# Patient Record
Sex: Male | Born: 1935 | Race: White | Hispanic: No | Marital: Married
Health system: Southern US, Community
[De-identification: ages and names within clinical notes are randomized; demographics above are authoritative.]

---

## 2002-04-02 ENCOUNTER — Ambulatory Visit: Admission: RE | Admit: 2002-04-02 | Discharge: 2002-06-14 | Payer: Self-pay | Admitting: *Deleted

## 2002-07-19 ENCOUNTER — Ambulatory Visit: Admission: RE | Admit: 2002-07-19 | Discharge: 2002-07-19 | Payer: Self-pay | Admitting: *Deleted

## 2002-07-26 ENCOUNTER — Ambulatory Visit: Admission: RE | Admit: 2002-07-26 | Discharge: 2002-07-26 | Payer: Self-pay | Admitting: *Deleted

## 2015-06-16 ENCOUNTER — Other Ambulatory Visit: Payer: Self-pay | Admitting: Rehabilitation

## 2015-06-16 DIAGNOSIS — M5136 Other intervertebral disc degeneration, lumbar region: Secondary | ICD-10-CM

## 2015-06-29 ENCOUNTER — Ambulatory Visit
Admission: RE | Admit: 2015-06-29 | Discharge: 2015-06-29 | Disposition: A | Discharge status: Home / Self Care | Payer: Medicare Other | Source: Ambulatory Visit | Attending: Rehabilitation | Admitting: Rehabilitation

## 2015-06-29 DIAGNOSIS — M5136 Other intervertebral disc degeneration, lumbar region: Secondary | ICD-10-CM

## 2015-06-29 MED ORDER — MEPERIDINE HCL 100 MG/ML IJ SOLN
50.0000 mg | Freq: Once | INTRAMUSCULAR | Status: AC
  Administered 2015-06-29: 50 mg via INTRAMUSCULAR

## 2015-06-29 MED ORDER — ONDANSETRON HCL 4 MG/2ML IJ SOLN
4.0000 mg | Freq: Once | INTRAMUSCULAR | Status: AC
  Administered 2015-06-29: 4 mg via INTRAMUSCULAR

## 2015-06-29 MED ORDER — IOHEXOL 180 MG/ML  SOLN
15.0000 mL | Freq: Once | INTRAMUSCULAR | Status: AC | PRN
  Administered 2015-06-29: 15 mL via INTRATHECAL

## 2015-06-29 NOTE — Discharge Instructions (Signed)

## 2016-05-14 IMAGING — CT CT L SPINE W/ CM
1 of 6 series · 6 of 14 positions shown, 8 images · non-contrast
Comparison: Noncontrast CT 04/24/2015.

CLINICAL DATA: Low back pain.  LEFT leg pain.
TECHNIQUE: Contiguous axial images were obtained through the Lumbar spine after
the intrathecal infusion of infusion. Coronal and sagittal
reconstructions were obtained of the axial image sets.

[Series 2: l spine soft · axial · 0.29mm/px · z∈[+889,+1054]mm · 6 of 79 slices shown, 8 images]
[im 12/79  soft-tissue]
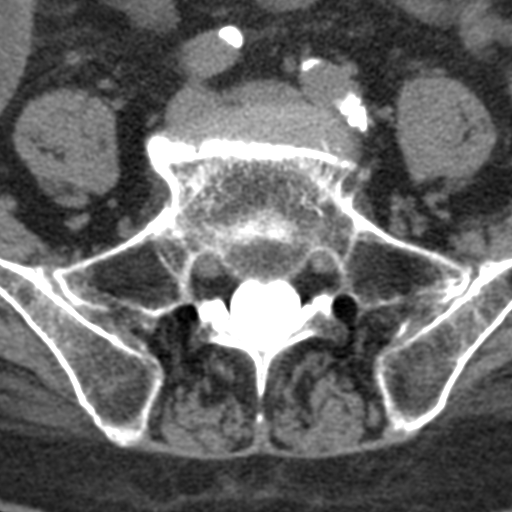
[im 12/79  bone]
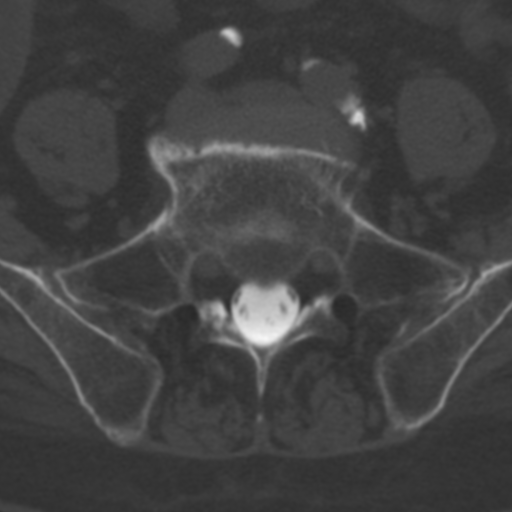
[im 23/79  bone]
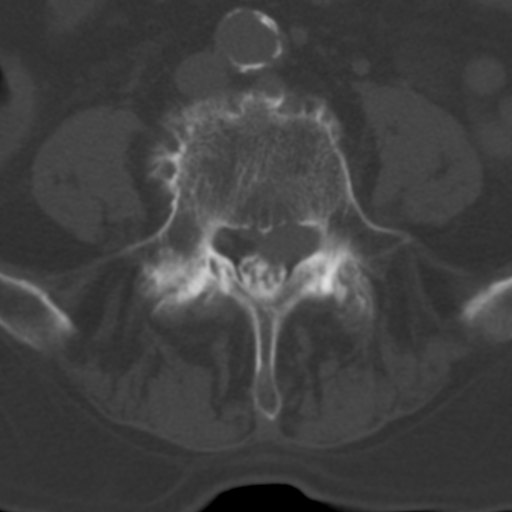
[im 34/79  bone]
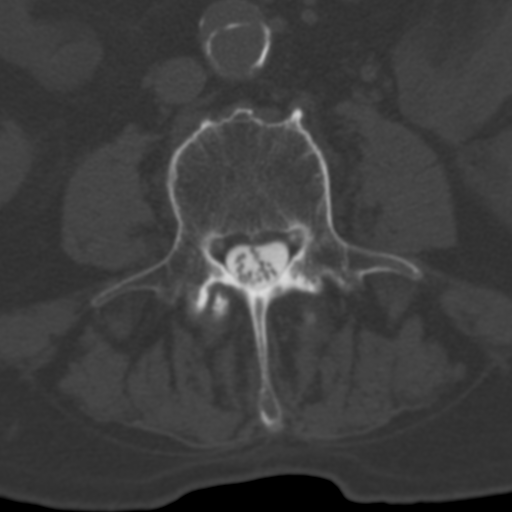
[im 45/79  bone]
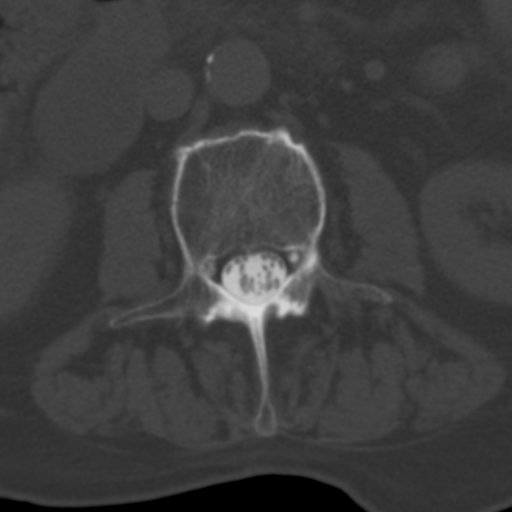
[im 56/79  soft-tissue]
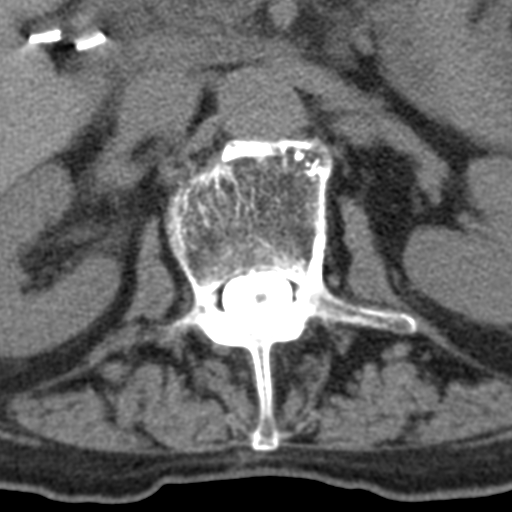
[im 56/79  bone]
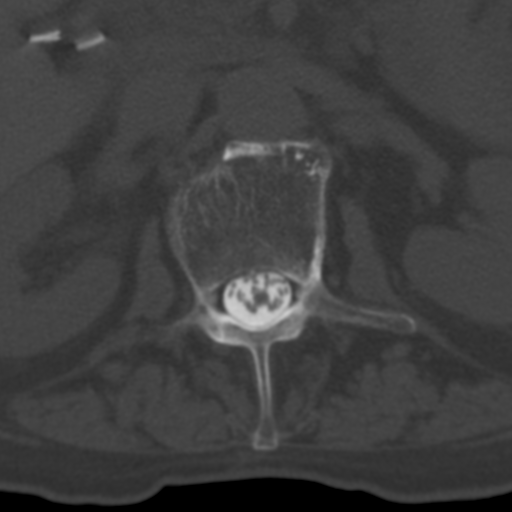
[im 67/79  bone]
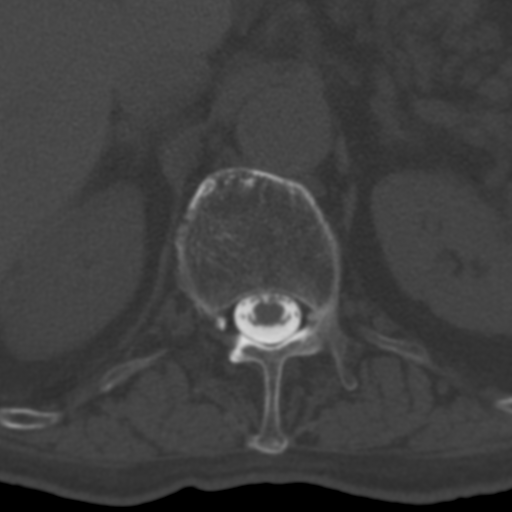

[6 of 14 positions shown; findings below may reference images not displayed]

EXAM:
LUMBAR MYELOGRAM

FLUOROSCOPY TIME:  1 minutes and 1 second.  Eighteen spot films.

PROCEDURE:
After thorough discussion of risks and benefits of the procedure
including bleeding, infection, injury to nerves, blood vessels,
adjacent structures as well as headache and CSF leak, written and
oral informed consent was obtained. Consent was obtained by Dr. Britteny
Zubair. Time out form was completed.

Patient was positioned prone on the fluoroscopy table. Local
anesthesia was provided with 1% lidocaine without epinephrine after
prepped and draped in the usual sterile fashion. Puncture was
performed at mid L3 level using a 3 1/2 inch 25 gauge spinal needle
via midline approach. Using a single pass through the dura, the
needle was placed within the thecal sac, with return of clear CSF.
15 mL of Isovue M 200 was injected into the thecal sac, with normal
opacification of the nerve roots and cauda equina consistent with
free flow within the subarachnoid space.

I personally performed the lumbar puncture and administered the
intrathecal contrast. I also personally supervised acquisition of
the myelogram images.
FINDINGS: LUMBAR MYELOGRAM FINDINGS:

Good opacification lumbar subarachnoid space. Near complete block at
L4-5 due to a large disc extrusion. BILATERAL L5 nerve root
impingement. Insufficient visualization of the L5-S1 level.
Thoracolumbar spine ankylosis to the level of L3. Disc space
narrowing L5-S1. Anatomic alignment without dynamic instability.
Osteopenia.

CT LUMBAR MYELOGRAM FINDINGS:

Segmentation: Normal.

Alignment:  Normal.

Vertebrae: No worrisome osseous lesion.Severe osteopenia.

Conus medullaris: Normal in size. Abnormally low termination at L2-3
without features of occult spinal dysraphism.

Paraspinal tissues: No evidence for hydronephrosis or paravertebral
mass. Cholecystectomy. Aortoiliac atherosclerosis.

Disc levels:

L1-L2:  Unremarkable.

L2-L3: Calcified annulus without significant protrusion. Facet
arthropathy. No impingement.

L3-L4: Unremarkable disc space. Anterior osseous spurring projects
into the retroperitoneum. Mild facet arthropathy. No impingement.

L4-L5: Mild disc space narrowing. Vacuum phenomenon. Subchondral
cyst formation of the endplates above and below. There is a
partially calcified central and leftward disc extrusion causing
severe spinal stenosis. Posterior element hypertrophy is
contributory. Disc osteophyte complex extends into both neural
foramina. LEFT greater than RIGHT L5 and L4 nerve root impingement.

L5-S1: Calcified broad-based, RIGHT paracentral protrusion.
BILATERAL facet arthropathy and ligamentum flavum hypertrophy.
Significant endplate spurring. Significant subarticular zone and
foraminal zone narrowing is multifactorial. BILATERAL S1 and L5
nerve root impingement is observed, slightly worse on the RIGHT.
IMPRESSION: LUMBAR MYELOGRAM IMPRESSION:

Near complete block at L4-5 due to a large central disc extrusion.
No dynamic instability. Disc space narrowing L5-S1.

CT LUMBAR MYELOGRAM IMPRESSION:

Severe spinal stenosis at L4-5 secondary to a partially calcified
central and leftward disc extrusion along with posterior element
hypertrophy. Osseous ridging. LEFT greater than RIGHT L5 and L4
nerve root impingement.

Disc space narrowing L5-S1 with a RIGHT paracentral protrusion.
Osseous ridging and posterior element hypertrophy or contributory to
BILATERAL RIGHT greater than LEFT L5 and S1 nerve root impingement.

## 2017-09-18 DIAGNOSIS — E039 Hypothyroidism, unspecified: Secondary | ICD-10-CM | POA: Diagnosis not present

## 2017-09-18 DIAGNOSIS — I639 Cerebral infarction, unspecified: Secondary | ICD-10-CM | POA: Diagnosis not present

## 2017-09-18 DIAGNOSIS — J9601 Acute respiratory failure with hypoxia: Secondary | ICD-10-CM

## 2017-09-18 DIAGNOSIS — J189 Pneumonia, unspecified organism: Secondary | ICD-10-CM | POA: Diagnosis not present

## 2017-09-18 DIAGNOSIS — A419 Sepsis, unspecified organism: Secondary | ICD-10-CM | POA: Diagnosis not present

## 2017-09-19 DIAGNOSIS — J9601 Acute respiratory failure with hypoxia: Secondary | ICD-10-CM | POA: Diagnosis not present

## 2017-09-19 DIAGNOSIS — J189 Pneumonia, unspecified organism: Secondary | ICD-10-CM | POA: Diagnosis not present

## 2017-09-19 DIAGNOSIS — I639 Cerebral infarction, unspecified: Secondary | ICD-10-CM | POA: Diagnosis not present

## 2017-09-19 DIAGNOSIS — A419 Sepsis, unspecified organism: Secondary | ICD-10-CM | POA: Diagnosis not present

## 2017-09-19 DIAGNOSIS — E039 Hypothyroidism, unspecified: Secondary | ICD-10-CM | POA: Diagnosis not present

## 2017-09-20 DIAGNOSIS — I639 Cerebral infarction, unspecified: Secondary | ICD-10-CM | POA: Diagnosis not present

## 2017-09-20 DIAGNOSIS — A419 Sepsis, unspecified organism: Secondary | ICD-10-CM | POA: Diagnosis not present

## 2017-09-20 DIAGNOSIS — J9601 Acute respiratory failure with hypoxia: Secondary | ICD-10-CM | POA: Diagnosis not present

## 2017-09-20 DIAGNOSIS — E039 Hypothyroidism, unspecified: Secondary | ICD-10-CM | POA: Diagnosis not present

## 2017-09-20 DIAGNOSIS — J189 Pneumonia, unspecified organism: Secondary | ICD-10-CM | POA: Diagnosis not present

## 2017-09-21 DIAGNOSIS — J189 Pneumonia, unspecified organism: Secondary | ICD-10-CM | POA: Diagnosis not present

## 2017-09-21 DIAGNOSIS — J9601 Acute respiratory failure with hypoxia: Secondary | ICD-10-CM | POA: Diagnosis not present

## 2019-09-30 DIAGNOSIS — J449 Chronic obstructive pulmonary disease, unspecified: Secondary | ICD-10-CM

## 2019-09-30 DIAGNOSIS — R410 Disorientation, unspecified: Secondary | ICD-10-CM

## 2019-09-30 DIAGNOSIS — E039 Hypothyroidism, unspecified: Secondary | ICD-10-CM

## 2019-09-30 DIAGNOSIS — R001 Bradycardia, unspecified: Secondary | ICD-10-CM

## 2019-09-30 DIAGNOSIS — Z950 Presence of cardiac pacemaker: Secondary | ICD-10-CM

## 2019-09-30 DIAGNOSIS — F0390 Unspecified dementia without behavioral disturbance: Secondary | ICD-10-CM

## 2019-09-30 DIAGNOSIS — I498 Other specified cardiac arrhythmias: Secondary | ICD-10-CM

## 2019-10-01 DIAGNOSIS — I498 Other specified cardiac arrhythmias: Secondary | ICD-10-CM | POA: Diagnosis not present

## 2019-10-01 DIAGNOSIS — F0390 Unspecified dementia without behavioral disturbance: Secondary | ICD-10-CM | POA: Diagnosis not present

## 2019-10-01 DIAGNOSIS — Z950 Presence of cardiac pacemaker: Secondary | ICD-10-CM | POA: Diagnosis not present

## 2019-10-01 DIAGNOSIS — J449 Chronic obstructive pulmonary disease, unspecified: Secondary | ICD-10-CM | POA: Diagnosis not present

## 2019-10-01 DIAGNOSIS — E039 Hypothyroidism, unspecified: Secondary | ICD-10-CM | POA: Diagnosis not present

## 2019-10-01 DIAGNOSIS — R001 Bradycardia, unspecified: Secondary | ICD-10-CM | POA: Diagnosis not present

## 2019-10-01 DIAGNOSIS — R410 Disorientation, unspecified: Secondary | ICD-10-CM | POA: Diagnosis not present

## 2019-10-02 DIAGNOSIS — R001 Bradycardia, unspecified: Secondary | ICD-10-CM | POA: Diagnosis not present

## 2019-10-02 DIAGNOSIS — J449 Chronic obstructive pulmonary disease, unspecified: Secondary | ICD-10-CM | POA: Diagnosis not present

## 2019-10-02 DIAGNOSIS — E039 Hypothyroidism, unspecified: Secondary | ICD-10-CM | POA: Diagnosis not present

## 2019-10-02 DIAGNOSIS — R410 Disorientation, unspecified: Secondary | ICD-10-CM | POA: Diagnosis not present

## 2019-10-03 DIAGNOSIS — E039 Hypothyroidism, unspecified: Secondary | ICD-10-CM | POA: Diagnosis not present

## 2019-10-03 DIAGNOSIS — R410 Disorientation, unspecified: Secondary | ICD-10-CM | POA: Diagnosis not present

## 2019-10-03 DIAGNOSIS — J449 Chronic obstructive pulmonary disease, unspecified: Secondary | ICD-10-CM | POA: Diagnosis not present

## 2019-10-03 DIAGNOSIS — R001 Bradycardia, unspecified: Secondary | ICD-10-CM | POA: Diagnosis not present

## 2019-10-04 DIAGNOSIS — J449 Chronic obstructive pulmonary disease, unspecified: Secondary | ICD-10-CM | POA: Diagnosis not present

## 2019-10-04 DIAGNOSIS — R001 Bradycardia, unspecified: Secondary | ICD-10-CM | POA: Diagnosis not present

## 2019-10-04 DIAGNOSIS — R410 Disorientation, unspecified: Secondary | ICD-10-CM | POA: Diagnosis not present

## 2019-10-04 DIAGNOSIS — E039 Hypothyroidism, unspecified: Secondary | ICD-10-CM | POA: Diagnosis not present

## 2020-01-09 DEATH — deceased
